# Patient Record
Sex: Male | Born: 1980 | Race: White | Hispanic: No | Marital: Single | Smoking: Current every day smoker
Health system: Southern US, Community
[De-identification: ages and names within clinical notes are randomized; demographics above are authoritative.]

## PROBLEM LIST (undated history)

## (undated) DIAGNOSIS — F3181 Bipolar II disorder: Secondary | ICD-10-CM

---

## 1998-04-10 ENCOUNTER — Emergency Department (HOSPITAL_COMMUNITY): Admission: EM | Admit: 1998-04-10 | Discharge: 1998-04-10 | Payer: Self-pay | Admitting: Emergency Medicine

## 1998-04-18 ENCOUNTER — Emergency Department (HOSPITAL_COMMUNITY): Admission: EM | Admit: 1998-04-18 | Discharge: 1998-04-18 | Payer: Self-pay | Admitting: Emergency Medicine

## 2011-12-20 ENCOUNTER — Encounter (HOSPITAL_COMMUNITY): Payer: Self-pay | Admitting: *Deleted

## 2011-12-20 ENCOUNTER — Emergency Department (HOSPITAL_COMMUNITY)
Admission: EM | Admit: 2011-12-20 | Discharge: 2011-12-21 | Disposition: A | Discharge status: Other Acute Inpatient Hospital | Payer: Self-pay | Attending: Emergency Medicine | Admitting: Emergency Medicine

## 2011-12-20 DIAGNOSIS — R45851 Suicidal ideations: Secondary | ICD-10-CM | POA: Insufficient documentation

## 2011-12-20 DIAGNOSIS — F1410 Cocaine abuse, uncomplicated: Secondary | ICD-10-CM

## 2011-12-20 DIAGNOSIS — F250 Schizoaffective disorder, bipolar type: Secondary | ICD-10-CM

## 2011-12-20 DIAGNOSIS — F17200 Nicotine dependence, unspecified, uncomplicated: Secondary | ICD-10-CM | POA: Insufficient documentation

## 2011-12-20 DIAGNOSIS — F1010 Alcohol abuse, uncomplicated: Secondary | ICD-10-CM

## 2011-12-20 DIAGNOSIS — F319 Bipolar disorder, unspecified: Secondary | ICD-10-CM | POA: Insufficient documentation

## 2011-12-20 HISTORY — DX: Bipolar II disorder: F31.81

## 2011-12-20 LAB — COMPREHENSIVE METABOLIC PANEL
ALT: 19 U/L (ref 0–53)
CO2: 29 mEq/L (ref 19–32)
Calcium: 9.2 mg/dL (ref 8.4–10.5)
Creatinine, Ser: 1.06 mg/dL (ref 0.50–1.35)
GFR calc Af Amer: >90 mL/min (ref >90)
GFR calc non Af Amer: >90 mL/min (ref >90)
Glucose, Bld: 104 mg/dL — ABNORMAL HIGH (ref 70–99)
Sodium: 138 mEq/L (ref 135–145)
Total Bilirubin: 0.2 mg/dL — ABNORMAL LOW (ref 0.3–1.2)

## 2011-12-20 LAB — RAPID URINE DRUG SCREEN, HOSP PERFORMED
Barbiturates: NOT DETECTED
Cocaine: POSITIVE — AB

## 2011-12-20 LAB — CBC
Hemoglobin: 13.8 g/dL (ref 13.0–17.0)
MCH: 30.9 pg (ref 26.0–34.0)
MCV: 89.7 fL (ref 78.0–100.0)
RBC: 4.47 MIL/uL (ref 4.22–5.81)

## 2011-12-20 LAB — URINALYSIS, ROUTINE W REFLEX MICROSCOPIC
Bilirubin Urine: NEGATIVE
Leukocytes, UA: NEGATIVE
Nitrite: NEGATIVE
Specific Gravity, Urine: 1.025 (ref 1.005–1.030)
pH: 6 (ref 5.0–8.0)

## 2011-12-20 MED ORDER — NICOTINE 21 MG/24HR TD PT24
21.0000 mg | MEDICATED_PATCH | Freq: Every day | TRANSDERMAL | Status: DC
  Administered 2011-12-20: 21 mg via TRANSDERMAL
  Filled 2011-12-20: qty 1

## 2011-12-20 MED ORDER — ACETAMINOPHEN 325 MG PO TABS: 650.0000 mg | ORAL_TABLET | ORAL | Status: DC | PRN

## 2011-12-20 MED ORDER — LORAZEPAM 1 MG PO TABS
1.0000 mg | ORAL_TABLET | Freq: Three times a day (TID) | ORAL | Status: DC | PRN
  Administered 2011-12-21: 1 mg via ORAL
  Filled 2011-12-20: qty 1

## 2011-12-20 MED ORDER — ZOLPIDEM TARTRATE 5 MG PO TABS: 5.0000 mg | ORAL_TABLET | Freq: Every evening | ORAL | Status: DC | PRN

## 2011-12-20 MED ORDER — ONDANSETRON HCL 4 MG PO TABS: 4.0000 mg | ORAL_TABLET | Freq: Three times a day (TID) | ORAL | Status: DC | PRN
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
## 2011-12-20 NOTE — ED Notes (Signed)
Denies SI/HI

## 2011-12-20 NOTE — ED Provider Notes (Signed)
Medical screening examination/treatment/procedure(s) were performed by non-physician practitioner and as supervising physician I was immediately available for consultation/collaboration.   Celene Kras, MD 12/20/11 [phone]

## 2011-12-20 NOTE — ED Provider Notes (Signed)
History     CSN: [id number]  Arrival date & time 12/20/11  1645   First MD Initiated Contact with Patient 12/20/11 1824      Chief Complaint  Patient presents with  . Medical Clearance    (Consider location/radiation/quality/duration/timing/severity/associated sxs/prior treatment) Patient is a 31 y.o. male presenting with mental health disorder. The history is provided by the patient.  Mental Health Problem The current episode started more than 1 month ago.  Associated symptoms comments: He reports history of bipolar disorder with frequent highs and lows. REcently he reports a fear of being alone as he becomes suicidal. "I don't know what I'm going to do to myself". . He admits to suicidal ideas. He does have a plan to commit suicide. He contemplates harming himself. He has not already injured self. He does not contemplate injuring another person. He has not already  injured another person.    Past Medical History  Diagnosis Date  . Bipolar 2 disorder     History reviewed. No pertinent past surgical history.  No family history on file.  History  Substance Use Topics  . Smoking status: Current Some Day Smoker  . Smokeless tobacco: Not on file  . Alcohol Use: Yes      Review of Systems  Constitutional: Negative for fever and chills.  HENT: Negative.   Respiratory: Negative.   Cardiovascular: Negative.   Gastrointestinal: Negative.   Musculoskeletal: Negative.   Skin: Negative.   Neurological: Negative.   Psychiatric/Behavioral: Positive for suicidal ideas.    Allergies  Review of patient's allergies indicates no known allergies.  Home Medications  No current outpatient prescriptions on file.  BP 124/85  Pulse 60  Temp(Src) 98.2 F (36.8 C) (Oral)  Resp 18  SpO2 98%  Physical Exam  Constitutional: He appears well-developed and well-nourished.  HENT:  Head: Normocephalic.  Neck: Normal range of motion. Neck supple.  Cardiovascular: Normal rate and  regular rhythm.   Pulmonary/Chest: Effort normal and breath sounds normal.  Abdominal: Soft. Bowel sounds are normal. There is no tenderness. There is no rebound and no guarding.  Musculoskeletal: Normal range of motion.  Neurological: He is alert. No cranial nerve deficit.  Skin: Skin is warm and dry. No rash noted.  Psychiatric: He has a normal mood and affect.    ED Course  Procedures (including critical care time)   Labs Reviewed  CBC  COMPREHENSIVE METABOLIC PANEL  ETHANOL  URINE RAPID DRUG SCREEN (HOSP PERFORMED)  URINALYSIS, ROUTINE W REFLEX MICROSCOPIC   No results found.   No diagnosis found.  1. Suicidal ideation 2. Bipolar   MDM          Rodena Medin, PA-C 12/20/11 1903

## 2011-12-20 NOTE — ED Notes (Addendum)
Wrong pt

## 2011-12-20 NOTE — ED Notes (Signed)
Pt reports being seen at [facility] for SI Sunday, was admitted and discharged Wednesday.  Pt states "im just concerned for my own well being right now."  Pt reports he's been drinking all week to try to take care of it myself.  Pt reports last drink was early this am, last cocaine was x 2 days ago.  Pt states that when he was admitted, he was "so doped up" with seroquel and i was not with it when the doctor came in to talk to me, and then when they sent him home, they just gave him rx  And pt reports that he does not have any money to fill them.  Pt reports almost overdosing on alcohol.  Pt reports he was at the hospital and was having seizures.

## 2011-12-20 NOTE — ED Notes (Signed)
Please see triage note

## 2011-12-21 ENCOUNTER — Encounter (HOSPITAL_COMMUNITY): Payer: Self-pay | Admitting: *Deleted

## 2011-12-21 ENCOUNTER — Inpatient Hospital Stay (HOSPITAL_COMMUNITY)
Admission: AD | Admit: 2011-12-21 | Discharge: 2011-12-27 | DRG: 885 | Disposition: A | Discharge status: Home / Self Care | Payer: PRIVATE HEALTH INSURANCE | Source: Ambulatory Visit | Attending: Psychiatry | Admitting: Psychiatry

## 2011-12-21 DIAGNOSIS — F1410 Cocaine abuse, uncomplicated: Secondary | ICD-10-CM | POA: Diagnosis present

## 2011-12-21 DIAGNOSIS — F250 Schizoaffective disorder, bipolar type: Secondary | ICD-10-CM | POA: Diagnosis present

## 2011-12-21 DIAGNOSIS — F3110 Bipolar disorder, current episode manic without psychotic features, unspecified: Principal | ICD-10-CM | POA: Diagnosis present

## 2011-12-21 DIAGNOSIS — F1010 Alcohol abuse, uncomplicated: Secondary | ICD-10-CM | POA: Diagnosis present

## 2011-12-21 DIAGNOSIS — F259 Schizoaffective disorder, unspecified: Secondary | ICD-10-CM | POA: Diagnosis present

## 2011-12-21 LAB — GLUCOSE, CAPILLARY: Glucose-Capillary: 87 mg/dL (ref 70–99)

## 2011-12-21 LAB — LITHIUM LEVEL: Lithium Lvl: 0.29 mEq/L — ABNORMAL LOW (ref 0.80–1.40)

## 2011-12-21 MED ORDER — ADULT MULTIVITAMIN W/MINERALS CH
1.0000 | ORAL_TABLET | Freq: Every day | ORAL | Status: DC
  Administered 2011-12-21 – 2011-12-27 (×7): 1 via ORAL
  Filled 2011-12-21 (×8): qty 1

## 2011-12-21 MED ORDER — TRAZODONE HCL 100 MG PO TABS
100.0000 mg | ORAL_TABLET | Freq: Every evening | ORAL | Status: DC | PRN
  Administered 2011-12-21 – 2011-12-23 (×2): 100 mg via ORAL
  Filled 2011-12-21 (×2): qty 1

## 2011-12-21 MED ORDER — NICOTINE 14 MG/24HR TD PT24
14.0000 mg | MEDICATED_PATCH | Freq: Every day | TRANSDERMAL | Status: DC
  Administered 2011-12-22 – 2011-12-26 (×5): 14 mg via TRANSDERMAL
  Filled 2011-12-21 (×9): qty 1

## 2011-12-21 MED ORDER — OLANZAPINE 10 MG PO TABS
10.0000 mg | ORAL_TABLET | Freq: Every day | ORAL | Status: DC
  Administered 2011-12-21: 10 mg via ORAL
  Filled 2011-12-21 (×3): qty 1

## 2011-12-21 MED ORDER — CARBAMAZEPINE 200 MG PO TABS
200.0000 mg | ORAL_TABLET | Freq: Every day | ORAL | Status: DC
  Filled 2011-12-21: qty 1

## 2011-12-21 MED ORDER — ADULT MULTIVITAMIN W/MINERALS CH: 1.0000 | ORAL_TABLET | Freq: Every day | ORAL | Status: DC

## 2011-12-21 MED ORDER — LITHIUM CARBONATE ER 450 MG PO TBCR
450.0000 mg | EXTENDED_RELEASE_TABLET | Freq: Every day | ORAL | Status: DC
  Administered 2011-12-21 – 2011-12-22 (×2): 450 mg via ORAL
  Filled 2011-12-21 (×4): qty 1

## 2011-12-21 MED ORDER — CARBAMAZEPINE 200 MG PO TABS
200.0000 mg | ORAL_TABLET | Freq: Every day | ORAL | Status: DC
  Administered 2011-12-21: 200 mg via ORAL
  Filled 2011-12-21 (×3): qty 1

## 2011-12-21 MED ORDER — MAGNESIUM HYDROXIDE 400 MG/5ML PO SUSP: 30.0000 mL | Freq: Every day | ORAL | Status: DC | PRN

## 2011-12-21 MED ORDER — MULTI-VITAMIN/MINERALS PO TABS: 1.0000 | ORAL_TABLET | Freq: Every day | ORAL | Status: DC

## 2011-12-21 MED ORDER — OLANZAPINE 5 MG PO TABS: 10.0000 mg | ORAL_TABLET | Freq: Every day | ORAL | Status: DC

## 2011-12-21 MED ORDER — ALUM & MAG HYDROXIDE-SIMETH 200-200-20 MG/5ML PO SUSP
30.0000 mL | ORAL | Status: DC | PRN
  Administered 2011-12-24 – 2011-12-27 (×2): 30 mL via ORAL

## 2011-12-21 MED ORDER — ACETAMINOPHEN 325 MG PO TABS
650.0000 mg | ORAL_TABLET | Freq: Four times a day (QID) | ORAL | Status: DC | PRN
  Administered 2011-12-25: 650 mg via ORAL

## 2011-12-21 NOTE — Discharge Planning (Signed)
Patient has been accepted to [facility] by [facility]  bed 404-2. Patient's support paperwork has been completed. EDP notified and is in agreement with disposition. EDP will discharge pt to [facility]. Pt nurse notified as well. ALL appropriate paperwork completed and forwarded to [facility] for review.   Manson Passey Castiel Lauricella ANN S , MSW, LCSWA 12/21/2011 10:25 AM [phone]

## 2011-12-21 NOTE — ED Notes (Signed)
Patient denies pain and is taking a shower at this time.

## 2011-12-21 NOTE — ED Notes (Signed)
Patient has been denied admission [facility] per act team.

## 2011-12-21 NOTE — Progress Notes (Addendum)
Patient ID: Glenn Rosales, male   DOB: 02/17/1981, 31 y.o.   MRN: [id number] 31 year old Caucasian male admitted with diagnosis of bipolar mania.  Patient states he was has [facility] from Sunday to Wednesday last week and did not fill prescriptions on release.  States he could not afford to.  States he was using cocaine and actually injected it for the first time.  He has a girlfriend and has four children with her, but says he cannot be with her until he "gets himself right."  States he had a job at [employer], but does not think he has that job any longer.  He was pleasant and cooperative with the admission process.  He was eating a lunch tray when he came into the search room and he finished it during the interview.  He was escorted back to the unit and oriented to the unit, the group schedules, and the medication times.  Patient minimized his drug and alcohol use during the admission stating that he only drank about once every two weeks.  He admitted that he drank excessively on these occasions, but it was only every two weeks.  He was also vague about his cocaine usage.  It is unclear how much or how often he was using.  States he was snorting and smoking it and yesterday was the first time he injected it.  Does not know what made him do this.  States he would like to "explore other options" when he gets ready to leave here.

## 2011-12-21 NOTE — Progress Notes (Signed)
In dayroom, watching TV with peers on approach. Agreed to speak with Clinical research associate. Appears flat and depressed. Calm and cooperative with assessment. No acute distress noted. States he feels like he is adjusting well to the milieu. States he is glad to be somewhere where they are checking his labs. States he has been taking Lithium for some time and has never had his Lithium levels checked. Support and encouragement provided. Did asked for a nicotine patch. Patch was ordered and he was informed it would begin tomorrow morning which he was agreeable with. Denies Pain. Denies SI/HI/AVH and contracts for safety. Offered no additional questions or concerns. POC and medications for the shift reviewed and understanding verbalized. Safety has been maintained with Q15 minute observation. Will continue current POC.

## 2011-12-22 DIAGNOSIS — F259 Schizoaffective disorder, unspecified: Secondary | ICD-10-CM

## 2011-12-22 LAB — T3, FREE: T3, Free: 3.2 pg/mL (ref 2.3–4.2)

## 2011-12-22 LAB — TSH: TSH: 0.5 u[IU]/mL (ref 0.350–4.500)

## 2011-12-22 MED ORDER — LITHIUM CARBONATE ER 450 MG PO TBCR
450.0000 mg | EXTENDED_RELEASE_TABLET | Freq: Two times a day (BID) | ORAL | Status: DC
  Filled 2011-12-22 (×2): qty 1

## 2011-12-22 MED ORDER — LITHIUM CARBONATE ER 450 MG PO TBCR
450.0000 mg | EXTENDED_RELEASE_TABLET | ORAL | Status: DC
  Administered 2011-12-22 – 2011-12-27 (×10): 450 mg via ORAL
  Filled 2011-12-22 (×13): qty 1

## 2011-12-22 MED ORDER — OLANZAPINE 5 MG PO TABS
5.0000 mg | ORAL_TABLET | Freq: Every day | ORAL | Status: DC
  Administered 2011-12-22 – 2011-12-26 (×5): 5 mg via ORAL
  Filled 2011-12-22 (×6): qty 1

## 2011-12-22 MED ORDER — SERTRALINE HCL 50 MG PO TABS
50.0000 mg | ORAL_TABLET | Freq: Every day | ORAL | Status: DC
  Administered 2011-12-22 – 2011-12-24 (×3): 50 mg via ORAL
  Filled 2011-12-22 (×5): qty 1

## 2011-12-22 NOTE — Progress Notes (Signed)
Patient ID: Glenn Rosales, male   DOB: [birth year], 31 y.o.   MRN: [id number] Pt is asleep in bed this PM but is participating in the milieu. Pt denies SI/HI and AVH. Pt is attending groups and is cooperative with staff. Pt forwards little to staff. Writer encouraged adequate fluid intake 2.5-3 L/day while using lithium therapy. Pt verbally acknowledged understanding.
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
## 2011-12-22 NOTE — Progress Notes (Signed)
12/22/2011         Time: 0930      Group Topic/Focus: The focus of this group is on enhancing patients' problem solving skills, which involves identifying the problem, brainstorming solutions and choosing and trying a solution.   Participation Level: Minimal  Participation Quality: Drowsy  Affect: Blunted  Cognitive: Oriented  Additional Comments: Patient laying on furniture, even standing with his eyes closed. Patient reports feeling "too weak."   Jefry Lesinski 12/22/2011 1:42 PM

## 2011-12-22 NOTE — Progress Notes (Signed)
Pt has been appropriate attending groups on the unit and has been sleeping on and off between groups. Pt denies si and hi. He rates depression as a 6 on 1-10 scale with 10 being the most depressed. Offered support and 15 minute checks. Safety maintained on unit.

## 2011-12-22 NOTE — Discharge Planning (Signed)
Met with patient in Aftercare Planning Group.   He slept through early part of group, then was irritable but not directed at anyone particular.  Patient states he does not know where he will go at discharge.  He has a home in [place] with his girlfriend and 4 children, but does not want to return there.  He states he wants a different life sometimes.  He remains positive for SI, states he likes taking various drugs because they make him feel different and that is desirable for him.  Patient was recently at [facility], was discharged recently with medication prescriptions.  While the assessment report states he could not afford those medications, he admitted to CM that he did not look into getting those meds, and he actually had been told at the hospital that some of them were on the $4 list at [facility], but he did not check into it.  Instead, he went on a binge after D/C from [facility].  He was using alcohol, marijuana, and cocaine; in fact, tried cocaine through IV for first time.  Patient was receiving services previously from [facility] in [place], but is not currently enrolled with them.  He states he went through their 45-month rehabilitation program.  He does not have current provider in place.  Patient rambled for some time about his job at [employer]	[place] and how if he kept that job he was going to kill himself and/or somebody else.  He said they were driving him crazy, externalized all factors involved with his job.  When CM asked if he needed help contacting them re being in the hospital, he stated he "gave that job away."  Patient states that his goal for his hospitalization is to be "stable minded", and added that a big problem for him is that he changes his mind constantly about things, including his feelings.  Glenn Mantle, LCSW 12/22/2011, 3:51 PM

## 2011-12-22 NOTE — H&P (Signed)
Psychiatric Admission Assessment Adult  Patient Identification:  Glenn Rosales Date of Evaluation:  12/22/2011  Chief Complaint:  BIPOLAR DISORDER MANIC  SUB ABUSE  History of Present Illness:: This is a 31 year old Caucasian male, admitted to [facility] from the [facility] ED with complaints of bipolar mania (periods of highs and lows). Patient reports, I came to this hospital to get my thoughts regulated. My thought pattern is all over the place. I have problem keeping track of my own thoughts. The things that I wanted to do changes so fast. I cannot reach any goal and I have reached any goals in my life. This has been going on with me off and on for many years. Sometimes it gets better on it's own, other time, it will get worse that I have go the hospital. I was at the [facility] last Wednesday and Sunday as well. I have no money to afford my medicines. I take Lithium Carbonate 450 mg every day. It is helping when I have it and can take it.  I am on Zyprexa and Tegretol as well. Tegretol makes me very sleepy. This is my first time in this hospital. I was at [facility] in 2009 for the problems. I drink and use cocaine sometimes just quiet my mind a little"  Mood Symptoms:  Hypomania/Mania, Mood Swings, Past 2 Weeks, Sadness, SI,  Depression Symptoms:  psychomotor agitation, difficulty concentrating,  (Hypo) Manic Symptoms:  Distractibility, Elevated Mood, Flight of Ideas, Irritable Mood,  Anxiety Symptoms:  Excessive Worry,  Psychotic Symptoms:  Hallucinations: None  PTSD Symptoms: Had a traumatic exposure:  None reported.  Past Psychiatric History: Diagnosis: Schizoaffective disorder  Hospitalizations: [facility], [facility]  Outpatient Care: "I don't have one"  Substance Abuse Care: None reported  Self-Mutilation: None reported  Suicidal Attempts: "No attempts, just thoughts"  Violent Behaviors: None reported   Past Medical History:   Past Medical History    Diagnosis Date  . Bipolar 2 disorder    None.  Allergies:  No Known Allergies  PTA Medications: Prescriptions prior to admission  Medication Sig Dispense Refill  . carbamazepine (TEGRETOL) 200 MG tablet Take 200 mg by mouth at bedtime.      Marland Kitchen lithium carbonate (ESKALITH) 450 MG CR tablet Take 450 mg by mouth daily. qhs      . Multiple Vitamins-Minerals (MULTIVITAMIN WITH MINERALS) tablet Take 1 tablet by mouth daily.      Marland Kitchen OLANZapine (ZYPREXA) 10 MG tablet Take 10 mg by mouth at bedtime.        Previous Psychotropic Medications:  Medication/Dose  Lithium Carbonate 450 mg  Tegretol 200 mg  Zyprexa 10 mg           Substance Abuse History in the last 12 months: Substance Age of 1st Use Last Use Amount Specific Type  Nicotine 15 Prior to hosp 5 cigarettes daily Cigarettes  Alcohol 18 "I drank last 2 weeks ago" 2-3 bottles of beer Beer  Cannabis Denies use     Opiates Denies use     Cocaine 16 Prior to hosp "I use what ever I have at a time" Crack  Methamphetamines Denies use     LSD Denies use     Ecstasy Denies use     Benzodiazepines Denies use     Caffeine      Inhalants      Others:  Consequences of Substance Abuse: Medical Consequences:  Liver damage, Legal Consequences:  Arrests, jail time Family Consequences:  Family discord  Social History: Current Place of Residence:  [place] copy[place] of Birth:  [place]  Family Members: "I have 4 children'  Marital Status:  Single  Children: 4     Relationships: "I have a girlfriend"  Education:  Special educational needs teacher Problems/Performance: None reported  Religious Beliefs/Practices: None reported  History of Abuse (Emotional/Phsycial/Sexual): None reported  Occupational Experiences: Insurance underwriter History:  None.  Legal History: None reported  Hobbies/Interests: None reported  Family History:  No family history on file.  Mental Status  Examination/Evaluation: Objective:  Appearance: Casual  Eye Contact::  Fair  Speech:  Clear and Coherent  Volume:  Normal  Mood:  Depressed  Affect:  Flat  Thought Process:  Coherent  Orientation:  Full  Thought Content:  Rumination  Suicidal Thoughts:  Yes.  without intent/plan  Homicidal Thoughts:  No  Memory:  Immediate;   Good Recent;   Good Remote;   Good  Judgement:  Poor  Insight:  Fair  Psychomotor Activity:  Normal  Concentration:  Fair  Recall:  Good  Akathisia:  No  Handed:  Right  AIMS (if indicated):     Assets:  Desire for Improvement  Sleep:  Number of Hours: 6.75     Laboratory/X-Ray: None Psychological Evaluation(s)      Assessment:    AXIS I:  Schizoaffective Disorder, bipolar type AXIS II:  Deferred AXIS III:   Past Medical History  Diagnosis Date  . Bipolar 2 disorder    AXIS IV:  educational problems, occupational problems, other psychosocial or environmental problems and problems related to social environment AXIS V:  21-30 behavior considerably influenced by delusions or hallucinations OR serious impairment in judgment, communication OR inability to function in almost all areas  Treatment Plan/Recommendations: Admit for safety and stabilization.                                                                Review and reinstate any pertinent home medications.                                                                Increase Lithium to 450 mg bid.                                                                Obtain lithium levels 12/23/11 night.  Treatment Plan Summary: Daily contact with patient to assess and evaluate symptoms and progress in treatment Medication management  Current Medications:  Current Facility-Administered Medications  Medication Dose Route Frequency Provider Last Rate Last Dose  . acetaminophen (TYLENOL) tablet 650 mg  650 mg Oral Q6H PRN Ronny Bacon, MD      . alum & mag hydroxide-simeth (MAALOX/MYLANTA)  200-200-20 MG/5ML suspension 30 mL  30 mL Oral Q4H PRN Curlene Labrum Readling, MD      . carbamazepine (TEGRETOL) tablet 200 mg  200 mg Oral QHS Curlene Labrum Readling, MD   200 mg at 12/21/11 2149  . lithium carbonate (ESKALITH) CR tablet 450 mg  450 mg Oral Daily Curlene Labrum Readling, MD   450 mg at 12/22/11 0812  . magnesium hydroxide (MILK OF MAGNESIA) suspension 30 mL  30 mL Oral Daily PRN Ronny Bacon, MD      . mulitivitamin with minerals tablet 1 tablet  1 tablet Oral Daily Curlene Labrum Readling, MD   1 tablet at 12/22/11 0812  . nicotine (NICODERM CQ - dosed in mg/24 hours) patch 14 mg  14 mg Transdermal Daily Curlene Labrum Readling, MD   14 mg at 12/22/11 1610  . OLANZapine (ZYPREXA) tablet 10 mg  10 mg Oral QHS Curlene Labrum Readling, MD   10 mg at 12/21/11 2149  . traZODone (DESYREL) tablet 100 mg  100 mg Oral QHS PRN Ronny Bacon, MD   100 mg at 12/21/11 2151  . DISCONTD: multivitamin with minerals tablet 1 tablet  1 tablet Oral Daily Ronny Bacon, MD        Observation Level/Precautions:  Q 15 minutes checks for safety  Laboratory:  Reviewed and noted ED lab findings on file.  Psychotherapy:  Group  Medications:  See medication list  Routine PRN Medications:  Yes  Consultations:  None indicated  Discharge Concerns:  Safety  Other:     Armandina Stammer I 4/3/20131:26 PM

## 2011-12-23 NOTE — Tx Team (Signed)
Interdisciplinary Treatment Plan Update (Adult)  Date:  12/23/2011  Time Reviewed:  10:15AM-11:00AM  Progress in Treatment: Attending groups:  Yes Participating in groups:    Yes Taking medication as prescribed:    Yes Tolerating medication:   Yes Family/Significant other contact made:  No, still needed Patient understands diagnosis:   Yes, to some extent Discussing patient identified problems/goals with staff:    Medical problems stabilized or resolved:   Yes Denies suicidal/homicidal ideation:  Yes Issues/concerns per patient self-inventory:   None Other:    New problem(s) identified: No, Describe:    Reason for Continuation of Hospitalization: Anxiety Depression Medication stabilization Other; describe Placement  Interventions implemented related to continuation of hospitalization:  Medication monitoring and adjustment, safety checks Q15 min., suicide risk assessment, group therapy, psychoeducation, collateral contact, aftercare planning, ongoing physician assessments, medication education  Additional comments:  Not applicable  Estimated length of stay:  3-4 days  Discharge Plan:  Undecided, is looking at [facility] versus [facility]; medication management will be set according to where he goes (which city)  [employer]):  Not applicable  Review of initial/current patient goals per problem list:   1.  Goal(s):  Determine aftercare placement and follow-up  Met:  No  Target date:  By Discharge   As evidenced by:  Still working on this - will either go to shelter or to an [facility] - both options being explored  2.  Goal(s):  Decrease mania to baseline level.  Met:  Yes  Target date:  By Discharge   As evidenced by:  Not manic  3.  Goal(s):  Determine if & how to address substance abuse  Met:  Yes  Target date:  By Discharge   As evidenced by:  Does not want to do anything re this issue other than going to his groups  4.  Goal(s):   Decrease anxiety to no greater than 3 at discharge.  Met:  No  Target date:  By Discharge   As evidenced by:  "7" today  Attendees: Patient:  Glenn Rosales  12/23/2011 10:15AM-11:00AM  Family:     Physician:  Dr. Harvie Heck Readling 12/23/2011 10:15AM-11:00AM  Nursing:   Waynetta Sandy, RN 12/23/2011 10:15AM -11:00AM   Case Manager:  Ambrose Mantle, LCSW 12/23/2011 10:15AM-11:00AM  Counselor:    Other:   Lynann Bologna, NP 12/23/2011 10:15AM-11:00AM  Other:      Other:      Other:       Scribe for Treatment Team:   Sarina Ser, 12/23/2011, 10:15AM-11:15AM .

## 2011-12-23 NOTE — Progress Notes (Addendum)
Pt is sleepy this morning and reports that he has an unusual sleep pattern related to his job in [employer]. He sleeps for a few hours and goes to work, returns home and sleeps a few hours then goes back to work. Pt denies si, hi or voices. His affect is blunted and depressed. Offered support and 15 minute checks. Safety maintained on unit. Pulse less than 60, reported to NP.

## 2011-12-23 NOTE — Progress Notes (Signed)
Patient in room sleeping when approached for PSA. Patient very groggy and did not want to participate at time ask and also refused option of completing in one hour. Patient did agree to meet tomorrow for completion. Patient chose to think about who he is willing to sign release for in order for counselor to provide suicide prevention information.  Clide Dales 12/23/2011 4:14 PM

## 2011-12-23 NOTE — Progress Notes (Signed)
Currently resting quietly in bed in right lateral position with eyes closed. Respirations are even and unlabored. No acute pain or distress noted. Safety has been maintained with Q85minute observation. Will continue current POC.

## 2011-12-24 MED ORDER — SERTRALINE HCL 100 MG PO TABS
100.0000 mg | ORAL_TABLET | Freq: Every day | ORAL | Status: DC
  Administered 2011-12-25 – 2011-12-27 (×3): 100 mg via ORAL
  Filled 2011-12-24 (×4): qty 1

## 2011-12-24 MED ORDER — TRAZODONE HCL 100 MG PO TABS
100.0000 mg | ORAL_TABLET | Freq: Every day | ORAL | Status: DC
  Administered 2011-12-24 – 2011-12-26 (×3): 100 mg via ORAL
  Filled 2011-12-24 (×4): qty 1

## 2011-12-24 MED ORDER — PANTOPRAZOLE SODIUM 40 MG PO TBEC
40.0000 mg | DELAYED_RELEASE_TABLET | Freq: Every day | ORAL | Status: DC
  Administered 2011-12-24 – 2011-12-26 (×3): 40 mg via ORAL
  Filled 2011-12-24 (×4): qty 1

## 2011-12-24 NOTE — Progress Notes (Signed)
Pt has been pleasant and appropriate  He attended group and is compliant with treatment   Pt reports being ready for discharge  He denies suicidal and homicidal ideation   And denies voices    Verbal support given  Medications administered and effectiveness monitored   Q 15 min checks  Pt safe at present

## 2011-12-24 NOTE — Progress Notes (Signed)
Patient ID: Glenn Rosales, male   DOB: 28-Sep-1980, 31 y.o.   MRN: [id number] Pt is awake and active on the unit this PM. Pt denies SI/HI and AVH. Pt is attending groups and is cooperative with staff. Pt states that he feels ready to d/c and that he just has to make a decision about where to live after he leaves. Pt is withdrawn but is interacting well in the milieu.

## 2011-12-24 NOTE — Progress Notes (Signed)
Pt's affect is flat and depressed. 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 Pt is considering living in [place], but is concerned over transportation to [employer] to see his children. Offered support, encouragement and 15 minute checks. Safety maintained on unit.

## 2011-12-24 NOTE — Progress Notes (Signed)
[facility] MD Progress Note  12/24/2011 12:38 PM  Diagnosis:  Axis I: Schizoaffective Disorder - Bipolar Type.  Alcohol Abuse.  Cocaine Abuse.   The patient was seen today and reports the following:   Sleep: The patient reports to having some difficulty initiating sleep but was able to maintain sleep without difficulty.  Appetite: The patient reports a good appetite.   Mild>(1-10) >Severe  Hopelessness (1-10): 3  Depression (1-10): 5  Anxiety (1-10): 0   Suicidal Ideation: The patient adamantly denies any suicidal ideations today.  Plan: No  Intent: No  Means: No   Homicidal Ideation: The patient adamantly denies any homicidal ideations today.  Plan: No  Intent: No.  Means: No   Eye Contact: Good.  General Appearance/Behavior: The patient remained friendly and cooperative during the interview today. Motor Behavior: wnl.  Speech: Appropriate in rate and volume with no pressuring of speech today.  Mental Status: Alert and Oriented x 3  Level of Consciousness: Alert Mood: Moderately Depressed.  Affect: Moderately Constricted.  Anxiety: No anxiety reported today.  Thought Process: wnl.  Thought Content: The patient denied any auditory or visual hallucinations today. He also denied any delusional thinking.  Perception: wnl.  Judgment: Fair to Good.  Insight: Fair to Good.  Cognition: Orientated to time, place and person.  Sleep:  Number of Hours: 4.5    Vital Signs:Blood pressure 129/84, pulse 65, temperature 98 F (36.7 C), temperature source Oral, resp. rate 16.  Current Medications: Current Facility-Administered Medications  Medication Dose Route Frequency Provider Last Rate Last Dose  . acetaminophen (TYLENOL) tablet 650 mg  650 mg Oral Q6H PRN Curlene Labrum Dariyah Garduno, MD      . alum & mag hydroxide-simeth (MAALOX/MYLANTA) 200-200-20 MG/5ML suspension 30 mL  30 mL Oral Q4H PRN Curlene Labrum Glendale Wherry, MD   30 mL at 12/24/11 0937  . lithium carbonate (ESKALITH) CR tablet 450 mg  450 mg  Oral BH-qamhs Curlene Labrum Elysa Womac, MD   450 mg at 12/24/11 0758  . magnesium hydroxide (MILK OF MAGNESIA) suspension 30 mL  30 mL Oral Daily PRN Ronny Bacon, MD      . mulitivitamin with minerals tablet 1 tablet  1 tablet Oral Daily Ronny Bacon, MD   1 tablet at 12/24/11 0757  . nicotine (NICODERM CQ - dosed in mg/24 hours) patch 14 mg  14 mg Transdermal Daily Curlene Labrum Siena Poehler, MD   14 mg at 12/24/11 0758  . OLANZapine (ZYPREXA) tablet 5 mg  5 mg Oral QHS Curlene Labrum Silas Sedam, MD   5 mg at 12/23/11 2140  . sertraline (ZOLOFT) tablet 100 mg  100 mg Oral Daily Curlene Labrum Jamail Cullers, MD      . traZODone (DESYREL) tablet 100 mg  100 mg Oral QHS PRN Curlene Labrum Merwyn Hodapp, MD   100 mg at 12/23/11 2337  . DISCONTD: sertraline (ZOLOFT) tablet 50 mg  50 mg Oral Daily Curlene Labrum Roseanna Koplin, MD   50 mg at 12/24/11 1610   Lab Results:  Results for orders placed during the hospital encounter of 12/21/11 (from the past 48 hour(s))  LITHIUM LEVEL     Status: Abnormal   Collection Time   12/23/11  8:13 PM      Component Value Range Comment   Lithium Lvl 0.30 (*) 0.80 - 1.40 (mEq/L)    Time was spent today discussing with the patient his current symptoms.  The patient states that he is having difficulty initiating sleep but is able to maintain sleep  once given the medication Trazodone.  The patient reports moderate depression but with no SI/HI.  He also denies any anxiety, auditory or visual hallucinations or delusional thinking.  The patient reports that he has yet to make up his mind where he will live at discharge but has a friend who is willing to assist him in getting into an [address].  He also reports that each day he feels improved regarding his psychiatric symptoms.  Treatment Plan Summary:  1. Daily contact with patient to assess and evaluate symptoms and progress in treatment  2. Medication management  3. The patient will deny suicidal ideations or homicidal ideations for 48 hours prior to discharge and have a  depression and anxiety rating of 3 or less. The patient will also deny any auditory or visual hallucinations or delusional thinking or display any manic or hypomanic behaviors.  4. The patient will display no evidence of substance withdrawal at time of discharge.   Plan:  1. Will continue the patient's current medications as ordered. 2. Will increase the medication Zoloft to 100 mgs po q am to further address his depressive symptoms. 3. Will change the medication Trazodone to 100 mgs po qhs instead of prn to help address the patient's sleep difficulties. 4. Laboratory studies reviewed.  5. Will continue to monitor.  6. Will repeat serum lithium level on December 26, 2011.   Glenn Rosales 12/24/2011, 12:38 PM
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
## 2011-12-24 NOTE — Progress Notes (Signed)
Patient ID: Glenn Rosales, male   DOB: 1981-07-24, 31 y.o.   MRN: [id number] Subjective: He reports he feels very good on the current meds.  He slept well all through the night.  Staff have complained that he is sleeping a lot during the day but he attributes this to needing to catch up on sleep after not sleeping for 10 days in a row while in a 'euphoric' mood prior to admission.   Past Psych Hx:  He reports being diagnosed with Bipolar disorder about 10 years ago, and has done best on Lithium in the past.  Depakote made him 'blow-up and gain a lot of weight', and he has also taken Trileptal and Abilify at various times.  He thinks the medications he is on now are working well and that he is improving.  He complains increase in anxiety and anticipation, now that he is no longer euphoric - he is realizing the state his life is in, and he is facing re-starting a lot of things again - such as having to look for work. Also not sure he is welcome back with his girlfriend anymore - and will be homeless.  He is encouraged by the Case Manager's efforts to get resources organized for him.   He rates his depression a 5/10 today on a 1-10 scale if 10 is the worst symptoms. He denies suicidal thoughts, and denies homicidal thoughts.  His appetite is good, and sleep is good.  Hopelessness is a 0/10, and Anxiety is a 7/10.   Objective: Glenn Rosales presents fully alert, calm, and cooperative.  Appropriately dressed and groomed. Thinking is linear, goal directed and well organized with appropriate questions.  Adequate insight.  Thinking about the future and wanting to get back to working.  No evidence of suicidal thoughts or intent. Speech normal.  Affect appropriate.  Polite and pleasant.  Thinking about discharge.  Cognition completely intact.  No evidence of EPS, motor is smooth.  Gait is normal.    Plan: Discussed in team meeting and team agrees he may be ready for discharge tomorrow. Cathleen Fears RN from the  [employer] will visit late today.  Stable on medications and will continue current plan.

## 2011-12-24 NOTE — Discharge Planning (Signed)
Patient was given [facility] yesterday for both [place] and [place].  He is still undecided about return to his family (girlfriend and 4 children) at discharge.  Patient does not want rehab.  Utilization review done for additional days.  Ambrose Mantle, LCSW 12/24/2011, 2:06 PM

## 2011-12-25 NOTE — Progress Notes (Signed)
[facility] MD Progress Note  12/25/2011 2:10 PM  Diagnosis:  Axis I: Schizoaffective Disorder - Bipolar Type.  Alcohol Abuse.  Cocaine Abuse.   Glenn Rosales was seen and assessed this morning. He reports feeling "about the same" today, and describes his mood as "decent." He says he hasn't had any "plummets or highs" recently. He describes his sleep as "good" and denies any problems with appetite or energy. He denies any side effects from his current medications.  Suicidal Ideation: The patient adamantly denies any suicidal ideations today.  Plan: No  Intent: No  Means: No   Homicidal Ideation: The patient adamantly denies any homicidal ideations today.  Plan: No  Intent: No.  Means: No   Eye Contact: Good.  General Appearance/Behavior: The patient remained friendly and cooperative during the interview today. Motor Behavior: wnl.  Speech: Appropriate in rate and volume with no pressuring of speech today.  Mental Status: Alert and Oriented x 3  Level of Consciousness: Alert Mood: "good" Affect: Moderately Constricted.  Anxiety: No anxiety reported today.  Thought Process: linear and goal-directed  Thought Content: The patient denied any auditory or visual hallucinations today. He also denied any delusional thinking. He denies SI/HI as noted above. Perception: wnl.  Judgment: Fair to Good.  Insight: Fair to Good.  Cognition: Orientated to time, place and person.  Sleep:  Number of Hours: 5.5    Vital Signs:Blood pressure 122/85, pulse 97, temperature 97.9 F (36.6 C), temperature source Oral, resp. rate 18.  Current Medications: Current Facility-Administered Medications  Medication Dose Route Frequency Provider Last Rate Last Dose  . acetaminophen (TYLENOL) tablet 650 mg  650 mg Oral Q6H PRN Ronny Bacon, MD   650 mg at 12/25/11 0903  . alum & mag hydroxide-simeth (MAALOX/MYLANTA) 200-200-20 MG/5ML suspension 30 mL  30 mL Oral Q4H PRN Curlene Labrum Readling, MD   30 mL at 12/24/11 0937    . lithium carbonate (ESKALITH) CR tablet 450 mg  450 mg Oral BH-qamhs Curlene Labrum Readling, MD   450 mg at 12/25/11 0843  . magnesium hydroxide (MILK OF MAGNESIA) suspension 30 mL  30 mL Oral Daily PRN Ronny Bacon, MD      . mulitivitamin with minerals tablet 1 tablet  1 tablet Oral Daily Ronny Bacon, MD   1 tablet at 12/25/11 0843  . nicotine (NICODERM CQ - dosed in mg/24 hours) patch 14 mg  14 mg Transdermal Daily Curlene Labrum Readling, MD   14 mg at 12/25/11 0843  . OLANZapine (ZYPREXA) tablet 5 mg  5 mg Oral QHS Curlene Labrum Readling, MD   5 mg at 12/24/11 2141  . pantoprazole (PROTONIX) EC tablet 40 mg  40 mg Oral QHS Curlene Labrum Readling, MD   40 mg at 12/24/11 2141  . sertraline (ZOLOFT) tablet 100 mg  100 mg Oral Daily Curlene Labrum Readling, MD   100 mg at 12/25/11 0843  . traZODone (DESYREL) tablet 100 mg  100 mg Oral QHS Curlene Labrum Readling, MD   100 mg at 12/24/11 2141   Lab Results:  Results for orders placed during the hospital encounter of 12/21/11 (from the past 48 hour(s))  LITHIUM LEVEL     Status: Abnormal   Collection Time   12/23/11  8:13 PM      Component Value Range Comment   Lithium Lvl 0.30 (*) 0.80 - 1.40 (mEq/L)    Treatment Plan Summary:  1. Daily contact with patient to assess and evaluate symptoms and progress in  treatment  2. Medication management  3. The patient will deny suicidal ideations or homicidal ideations for 48 hours prior to discharge and have a depression and anxiety rating of 3 or less. The patient will also deny any auditory or visual hallucinations or delusional thinking or display any manic or hypomanic behaviors.  4. The patient will display no evidence of substance withdrawal at time of discharge.   Plan:  1. Will continue the patient's current medications as ordered. 2. Laboratory studies reviewed.  3. Will continue to monitor.  4. Will repeat serum lithium level on December 26, 2011.   Eligah East 12/25/2011, 2:10 PM

## 2011-12-25 NOTE — Progress Notes (Signed)
[facility] Group Notes:  (Counselor/Nursing/MHT/Case Management/Adjunct)  12/25/2011 3:20 PM  Type of Therapy:  Group Therapy  Participation Level:  Did Not Attend    Neila Gear 12/25/2011, 3:20 PM

## 2011-12-25 NOTE — Progress Notes (Signed)
Pt has isolated to his room most of the day  He is depressed and sad  He has limited interaction with others  His speech and  thinking is logical and coherent.  He denies SI/HI and psychotic symptoms   Verbal support given  Medications administered and effectiveness monitored  Q 15 min checks  Pt safe at present

## 2011-12-25 NOTE — Progress Notes (Signed)
Patient ID: Glenn Rosales, male   DOB: 04-19-1981, 31 y.o.   MRN: [id number] The patient complained of feeling anxious and wanted something for anxiety. Stated he was not sure if the doctor realized that anxiety was an ongoing problem he had. Was able to to interact in the milieu appropriately. Attended evening group. Denied any A/V hallucinations.

## 2011-12-26 LAB — LITHIUM LEVEL: Lithium Lvl: 0.32 mEq/L — ABNORMAL LOW (ref 0.80–1.40)

## 2011-12-26 MED ORDER — HYDROXYZINE HCL 50 MG PO TABS
50.0000 mg | ORAL_TABLET | ORAL | Status: AC
  Administered 2011-12-26: 50 mg via ORAL

## 2011-12-26 NOTE — Progress Notes (Signed)
[facility] Group Notes:  (Counselor/Nursing/MHT/Case Management/Adjunct)  12/26/2011 10:30 AM  Type of Therapy:  Group Therapy, Dance/Movement Therapy   Participation Level:  Did Not Attend  Glenn Rosales   

## 2011-12-26 NOTE — Progress Notes (Signed)
Patient ID: Glenn Rosales, male   DOB: 1980/10/04, 31 y.o.   MRN: [id number] [facility] MD Progress Note  12/26/2011 2:06 PM  Diagnosis:  Axis I: Schizoaffective Disorder - Bipolar Type.  Alcohol Abuse.  Cocaine Abuse.   Glenn Rosales was seen and assessed this morning. He reports some trouble falling asleep last night, but then slept well through the remainder of the night. Nursing report, however, indicates only 2.75 hours of sleep. He describes his mood as "better but not perfect." Appetite is good. He denies problems with medications. He denies suicidal or homicidal thoughts. He reports feeling about ready to leave the hospital. He is aware a lithium level is to be drawn today.   Suicidal Ideation: The patient adamantly denies any suicidal ideations today.  Plan: No  Intent: No  Means: No   Homicidal Ideation: The patient adamantly denies any homicidal ideations today.  Plan: No  Intent: No.  Means: No   Eye Contact: Good.  General Appearance/Behavior: The patient remained friendly and cooperative during the interview today. Motor Behavior: wnl.  Speech: Appropriate in rate and volume with no pressuring of speech today.  Mental Status: Alert and Oriented x 3  Level of Consciousness: Alert Mood: "better, but not perfect" Affect: Moderately Constricted.  Anxiety: No anxiety reported today.  Thought Process: linear and goal-directed  Thought Content: The patient denied any auditory or visual hallucinations today. He also denied any delusional thinking. He denies SI/HI as noted above. Perception: wnl.  Judgment: Fair to Good.  Insight: Fair to Good.  Cognition: Orientated to time, place and person.  Sleep:  Number of Hours: 2.75    Vital Signs:Blood pressure 115/78, pulse 76, temperature 97.8 F (36.6 C), temperature source Oral, resp. rate 16.  Current Medications: Current Facility-Administered Medications  Medication Dose Route Frequency Provider Last Rate Last Dose  . acetaminophen  (TYLENOL) tablet 650 mg  650 mg Oral Q6H PRN Ronny Bacon, MD   650 mg at 12/25/11 0903  . alum & mag hydroxide-simeth (MAALOX/MYLANTA) 200-200-20 MG/5ML suspension 30 mL  30 mL Oral Q4H PRN Curlene Labrum Readling, MD   30 mL at 12/24/11 0937  . hydrOXYzine (ATARAX/VISTARIL) tablet 50 mg  50 mg Oral NOW Larena Sox, MD   50 mg at 12/26/11 0157  . lithium carbonate (ESKALITH) CR tablet 450 mg  450 mg Oral BH-qamhs Curlene Labrum Readling, MD   450 mg at 12/26/11 0821  . magnesium hydroxide (MILK OF MAGNESIA) suspension 30 mL  30 mL Oral Daily PRN Ronny Bacon, MD      . mulitivitamin with minerals tablet 1 tablet  1 tablet Oral Daily Ronny Bacon, MD   1 tablet at 12/26/11 0821  . nicotine (NICODERM CQ - dosed in mg/24 hours) patch 14 mg  14 mg Transdermal Daily Curlene Labrum Readling, MD   14 mg at 12/26/11 0821  . OLANZapine (ZYPREXA) tablet 5 mg  5 mg Oral QHS Curlene Labrum Readling, MD   5 mg at 12/25/11 2100  . pantoprazole (PROTONIX) EC tablet 40 mg  40 mg Oral QHS Curlene Labrum Readling, MD   40 mg at 12/25/11 2100  . sertraline (ZOLOFT) tablet 100 mg  100 mg Oral Daily Curlene Labrum Readling, MD   100 mg at 12/26/11 0821  . traZODone (DESYREL) tablet 100 mg  100 mg Oral QHS Curlene Labrum Readling, MD   100 mg at 12/25/11 2101   Lab Results:  No results found for this or any previous  visit (from the past 48 hour(s)). Treatment Plan Summary:  1. Daily contact with patient to assess and evaluate symptoms and progress in treatment  2. Medication management  3. The patient will deny suicidal ideations or homicidal ideations for 48 hours prior to discharge and have a depression and anxiety rating of 3 or less. The patient will also deny any auditory or visual hallucinations or delusional thinking or display any manic or hypomanic behaviors.  4. The patient will display no evidence of substance withdrawal at time of discharge.   Plan:  1. Will continue the patient's current medications as ordered. Consider adjusting  lithium dose and/or scheduling based on blood level and if insomnia persists. 2. Laboratory studies reviewed.  3. Will continue to monitor.  4. Will repeat serum lithium level on December 26, 2011.   Glenn Rosales 12/26/2011, 2:06 PM

## 2011-12-26 NOTE — Progress Notes (Signed)
Pt has isolated most of the day today   He asked to be allowed to sleep in as he did not sleep well last night   Pt did not attend group and has minimal interaction with others   He appears depressed and sad but denies suicidal thoughts  Verbal support given  Medications administered and effectiveness monitored  Q 15 min checks  Pt safe at present

## 2011-12-27 DIAGNOSIS — F1410 Cocaine abuse, uncomplicated: Secondary | ICD-10-CM

## 2011-12-27 DIAGNOSIS — F1010 Alcohol abuse, uncomplicated: Secondary | ICD-10-CM

## 2011-12-27 MED ORDER — SERTRALINE HCL 100 MG PO TABS: 100.0000 mg | ORAL_TABLET | Freq: Every day | ORAL | Status: DC

## 2011-12-27 MED ORDER — MULTI-VITAMIN/MINERALS PO TABS: 1.0000 | ORAL_TABLET | Freq: Every day | ORAL | Status: DC

## 2011-12-27 MED ORDER — PANTOPRAZOLE SODIUM 40 MG PO TBEC: 40.0000 mg | DELAYED_RELEASE_TABLET | Freq: Every day | ORAL | Status: DC

## 2011-12-27 MED ORDER — OLANZAPINE 5 MG PO TABS: 5.0000 mg | ORAL_TABLET | Freq: Every day | ORAL | Status: DC

## 2011-12-27 MED ORDER — LITHIUM CARBONATE ER 450 MG PO TBCR: 450.0000 mg | EXTENDED_RELEASE_TABLET | ORAL | Status: DC

## 2011-12-27 MED ORDER — TRAZODONE HCL 100 MG PO TABS: 100.0000 mg | ORAL_TABLET | Freq: Every day | ORAL | Status: DC

## 2011-12-27 NOTE — Tx Team (Signed)
Interdisciplinary Treatment Plan Update (Adult)  Date:  12/27/2011  Time Reviewed:  10:15AM-11:00AM  Progress in Treatment: Attending groups:  Yes Participating in groups:    Yes, when attends Taking medication as prescribed:    Yes Tolerating medication:   Yes Family/Significant other contact made:  No, but gives permission during treatment team for mother to be contacted to provide SPI Patient understands diagnosis:   Yes Discussing patient identified problems/goals with staff:   Yes Medical problems stabilized or resolved:   Yes Denies suicidal/homicidal ideation:  Yes Issues/concerns per patient self-inventory: Yes   Other:    New problem(s) identified: No, Describe:    Reason for Continuation of Hospitalization: None  Interventions implemented related to continuation of hospitalization:  Medication monitoring and adjustment, safety checks Q15 min., suicide risk assessment, group therapy, psychoeducation, collateral contact, aftercare planning, ongoing physician assessments, medication education - UNTIL DISCHARGE  Additional comments:  Not applicable  Estimated length of stay:  Discharge today  Discharge Plan:  Go to an interview at [facility] on [address], appointment will be set at [facility] prior to D/C.  New goal(s):  Not applicable  Review of initial/current patient goals per problem list:   1.  Goal(s):  Determine aftercare placement and follow-up  Met:  Yes  Target date:  By Discharge   As evidenced by:  Wants to get into an [facility], has   2.  Goal(s):  Decrease mania to baseline level.  Met:  Yes  Target date:  By Discharge   As evidenced by:  No mania in evidence  3.  Goal(s):  Determine if & how to address substance abuse  Met:  Yes  Target date:  By Discharge   As evidenced by:  Will be at [facility], go to meetings  4.  Goal(s):  Decrease anxiety to no greater than 3 at discharge.  Met:  Yes  Target date:  By Discharge   As  evidenced by:  "2" today  Attendees: Patient:  Glenn Rosales  12/27/2011 10:15AM-11:00AM  Family:     Physician:  Dr. Harvie Heck Readling 12/27/2011 10:15AM-11:00AM  Nursing:   Manuela Schwartz, RN 12/27/2011 10:15AM -11:00AM   Case Manager:  Ambrose Mantle, LCSW 12/27/2011 10:15AM-11:00AM  Counselor:  Veto Kemps, MT-BC 12/27/2011 10:15AM-11:00AM  Other:   Neill Loft, RN 12/27/2011 10:15AM-11:00AM  Other:   Osborn Coho, LCSW-A 12/27/2011 10:15AM-11:00AM  Other:      Other:       Scribe for Treatment Team:   Sarina Ser, 12/27/2011, 10:15AM-11:15AM
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
## 2011-12-27 NOTE — Progress Notes (Signed)
Patient ID: Glenn Rosales, male   DOB: 1981-06-09, 31 y.o.   MRN: [id number] The patient stated he was having a better evening tonight. He was interacting appropriately in the milieu. Offered no complaints. Did not request any PRN medication.

## 2011-12-27 NOTE — Progress Notes (Signed)
[facility] Case Management Discharge Plan:  Will you be returning to the same living situation after discharge: No. At discharge, do you have transportation home?:Yes,  Bus Do you have the ability to pay for your medications:Yes,  income and is being given samples.  Interagency Information:     Release of information consent forms completed and in the chart;  Patient's signature needed at discharge.  Patient to Follow up at:  Follow-up Information    Follow up with [facility] on 01/04/2012. (Pt is scheduled to f/u with [facility] on 4/16@9 :30am.)    Contact information:   [address] [place], [place] [address] [phone]         Patient denies SI/HI:   Yes,  yes    Safety Planning and Suicide Prevention discussed:  Yes,  yes  Barrier to discharge identified:No.  Summary and Recommendations:  Pt is being discharged on today. Pt will be given medication samples and a bus pass to connect with the [facility] bus to be transported to [place]. Pt verbalized that he has an interview scheduled with the [facility] in [place] on today.  Lalena Salas, Randal Buba 12/27/2011, 12:51 PM

## 2011-12-27 NOTE — Progress Notes (Signed)
Patient ID: Glenn Rosales, male   DOB: 08/20/1981, 31 y.o.   MRN: [id number]   Patient wanting discharge today. Team met with patient and felt patient ready to be discharged. Obtained belongings, medications, prescriptions, f/u appointment, and bus passes. Taken to locker and pointed the way to bus stop. Denies any SI on discharge.

## 2011-12-27 NOTE — Progress Notes (Signed)
[facility] Adult Inpatient Family/Significant Other Suicide Prevention Education  Suicide Prevention Education:  Education Completed; Glenn Rosales [phone]) (mother) has been identified as the person(s) who will aid the patient in the event of a mental health crisis (suicidal ideations/suicide attempt).  With written consent from the patient, the family member/significant other has been provided the following suicide prevention education, prior to the and/or following the discharge of the patient.  The suicide prevention education provided includes the following:  Suicide risk factors  Suicide prevention and interventions  National Suicide Hotline telephone number  [facility] assessment telephone number  [facility] Emergency Assistance 911  [facility] and/or Residential Mobile Crisis Unit telephone number  Request made of family/significant other to:  Remove weapons (e.g., guns, rifles, knives), all items previously/currently identified as safety concern.    Remove drugs/medications (over-the-counter, prescriptions, illicit drugs), all items previously/currently identified as a safety concern.  The family member/significant other verbalizes understanding of the suicide prevention education information provided.  The family member/significant other agrees to remove the items of safety concern listed above.  Glenn Rosales, Aram Beecham 12/27/2011, 2:25 PM

## 2011-12-29 NOTE — Progress Notes (Signed)
Patient Discharge Instructions:  Psychiatric Admission Assessment Note Provided,  12/29/2011 After Visit Summary (AVS) Provided,  12/29/2011 Face Sheet Provided, 12/29/2011 Faxed/Sent to the Next Level Care provider:  12/29/2011 Sent Suicide Risk Assessment - Discharge Assessment 12/29/2011  Faxed to [facility] @ [phone]  Wandra Scot, 12/29/2011, 2:46 PM

## 2012-01-03 NOTE — Discharge Summary (Signed)
Physician Discharge Summary Note  Patient:  Glenn Rosales is an 31 y.o., male MRN:  [id number] DOB:  May 15, 1981 Patient phone:  [phone] (home)  Patient address:   [address] [place] [address],   Date of Admission:  12/21/2011 Date of Discharge: 12/27/2011  Axis Diagnosis:   AXIS I:  Schizoaffective disorder; Alcohol abuse; Cocaine Abuse AXIS II:  No diagnosis AXIS III:  No diagnosis Past Medical History  Diagnosis Date  . Bipolar 2 disorder    AXIS IV:  economic problems, housing problems and occupational problems AXIS V:  61-70 mild symptoms  Level of Care:  OP  Hospital Course: First behavioral health admission for Xsavier who presented in our emergency room requesting help with his mood after binging on alcohol and cocaine.  He had recently been admitted to [facility] for an exacerbation of his bipolar disorder which was diagnosed in 2009. After leaving [facility] he did not fill any of his prescriptions and began binging on cocaine and alcohol. He also reported that he was hearing some voices had had some suicidal thoughts but denied intent to harm himself. He was asked for help getting stable.  He had a past history of being diagnosed with bipolar disorder in 2009 with previous hospitalizations at [facility]. In the past he had done well on lithium and we elected to restart him on 450 mg of Eskalith twice daily. He was started on Zoloft to address depressive symptoms. He tolerated medications well his recovery was uneventful. His mood stabilized insight improved, and group participation was good. He was polite, cooperative, and appropriate while on the unit. Zyprexa was added to enhance mood stability and alleviate auditory hallucinations.  By April 8 he was free of suicidal thoughts for at least 3 days, fully alert and appropriate for outpatient treatment. He was discharged on stable medications and 30 day prescriptions.  Consults:   None  Significant Diagnostic Studies:  Lithium level 0.32 on current dose. CBC and metabolic panels normal. BUN 15, creatinine 1.06. TSH 0.500, free T4 1.09. Urine drug screen positive for cocaine.  Discharge Vitals:   Blood pressure 112/71, pulse 81, temperature 98.2 F (36.8 C), temperature source Oral, resp. rate 20.  Mental Status Exam: See Mental Status Examination and Suicide Risk Assessment completed by Attending Physician prior to discharge.  Discharge destination:  Home  Is patient on multiple antipsychotic therapies at discharge:  No   Has Patient had three or more failed trials of antipsychotic monotherapy by history:  No  Recommended Plan for Multiple Antipsychotic Therapies: N/A   Medication List  As of 01/03/2012 10:03 AM   STOP taking these medications         carbamazepine 200 MG tablet         TAKE these medications      Indication    lithium carbonate 450 MG CR tablet   Commonly known as: ESKALITH   Take 1 tablet (450 mg total) by mouth 2 (two) times daily in the am and at bedtime.. For mood stability.       multivitamin with minerals tablet   Take 1 tablet by mouth daily. Vitamin supplement.       OLANZapine 5 MG tablet   Commonly known as: ZYPREXA   Take 1 tablet (5 mg total) by mouth at bedtime. For mood stability and psychosis.       pantoprazole 40 MG tablet   Commonly known as: PROTONIX   Take 1  tablet (40 mg total) by mouth at bedtime. For acid reflux.       sertraline 100 MG tablet   Commonly known as: ZOLOFT   Take 1 tablet (100 mg total) by mouth daily. For depression.       traZODone 100 MG tablet   Commonly known as: DESYREL   Take 1 tablet (100 mg total) by mouth at bedtime. For sleep.            Follow-up Information    Follow up with [facility] on 01/04/2012. (Pt is scheduled to f/u with [facility] on 4/16@9 :30am.)    Contact information:   [address] [place], [place] [address] [phone]         Follow-up  recommendations:  Activity:  unrestricted Diet:  regular  Signed: Anber Mckiver A 01/03/2012, 10:03 AM

## 2016-01-01 ENCOUNTER — Emergency Department (HOSPITAL_BASED_OUTPATIENT_CLINIC_OR_DEPARTMENT_OTHER): Payer: Self-pay

## 2016-01-01 ENCOUNTER — Emergency Department (HOSPITAL_BASED_OUTPATIENT_CLINIC_OR_DEPARTMENT_OTHER)
Admission: EM | Admit: 2016-01-01 | Discharge: 2016-01-01 | Disposition: A | Discharge status: Home / Self Care | Payer: Self-pay | Attending: Emergency Medicine | Admitting: Emergency Medicine

## 2016-01-01 ENCOUNTER — Encounter (HOSPITAL_BASED_OUTPATIENT_CLINIC_OR_DEPARTMENT_OTHER): Payer: Self-pay

## 2016-01-01 DIAGNOSIS — J069 Acute upper respiratory infection, unspecified: Secondary | ICD-10-CM

## 2016-01-01 DIAGNOSIS — F17210 Nicotine dependence, cigarettes, uncomplicated: Secondary | ICD-10-CM | POA: Insufficient documentation

## 2016-01-01 LAB — RAPID STREP SCREEN (MED CTR MEBANE ONLY): STREPTOCOCCUS, GROUP A SCREEN (DIRECT): NEGATIVE

## 2016-01-01 MED ORDER — BENZONATATE 100 MG PO CAPS: 100.0000 mg | ORAL_CAPSULE | Freq: Three times a day (TID) | ORAL | Status: AC

## 2016-01-01 NOTE — ED Provider Notes (Signed)
CSN: [id number]     Arrival date & time 01/01/16  1601 History   First MD Initiated Contact with Patient 01/01/16 1733     Chief Complaint  Patient presents with  . Cough     (Consider location/radiation/quality/duration/timing/severity/associated sxs/prior Treatment) Patient is a 35 y.o. male presenting with cough. The history is provided by the patient.  Cough Cough characteristics:  Productive Sputum characteristics:  Clear Onset quality:  Gradual Duration:  2 weeks Progression since onset: Improved, but no again worsening. Chronicity:  New Smoker: yes (occasional)   Ineffective treatments: 10 day course of antibiotics and 5 day course of Prednisone. Associated symptoms: sore throat   Associated symptoms: no chest pain, no ear pain, no fever, no headaches, no shortness of breath and no wheezing     Past Medical History  Diagnosis Date  . Bipolar 2 disorder (HCC)    History reviewed. No pertinent past surgical history. No family history on file. Social History  Substance Use Topics  . Smoking status: Current Every Day Smoker -- 0.00 packs/day for 0 years    Types: Cigarettes  . Smokeless tobacco: None  . Alcohol Use: Yes     Comment: weekly    Review of Systems  Constitutional: Negative for fever.  HENT: Positive for sore throat. Negative for ear pain.   Respiratory: Positive for cough. Negative for shortness of breath and wheezing.   Cardiovascular: Negative for chest pain.  Neurological: Negative for headaches.  All other systems reviewed and are negative.     Allergies  Review of patient's allergies indicates no known allergies.  Home Medications   Prior to Admission medications   Not on File   BP 120/75 mmHg  Pulse 63  Temp(Src) 97.7 F (36.5 C) (Oral)  Resp 20  Ht  (1.727 m)  Wt 72.576 kg  BMI 24.33 kg/m2  SpO2 98% Physical Exam  Constitutional: He appears well-developed and well-nourished.  HENT:  Head: Normocephalic and atraumatic.   Right Ear: Tympanic membrane and ear canal normal.  Left Ear: Tympanic membrane and ear canal normal.  Nose: Nose normal.  Mouth/Throat: Uvula is midline and oropharynx is clear and moist. No trismus in the jaw. No oropharyngeal exudate, posterior oropharyngeal edema or posterior oropharyngeal erythema.  No obvious edema of the uvula  Neck: Normal range of motion. Neck supple.  Cardiovascular: Normal rate, regular rhythm and normal heart sounds.   Pulmonary/Chest: Effort normal and breath sounds normal. No respiratory distress. He has no wheezes. He has no rales. He exhibits no tenderness.  Musculoskeletal: Normal range of motion.  Neurological: He is alert.  Skin: Skin is warm and dry.  Psychiatric: He has a normal mood and affect.  Nursing note and vitals reviewed.   ED Course  Procedures (including critical care time) Labs Review Labs Reviewed - No data to display  Imaging Review Dg Chest 2 View  01/01/2016  CLINICAL DATA:  Patient with fever, cough and shortness of breath. History of bronchitis. EXAM: CHEST  2 VIEW COMPARISON:  Chest radiograph 09/09/2015 FINDINGS: Stable cardiac and mediastinal contours. No consolidative pulmonary opacities. No pleural effusion or pneumothorax. Mid thoracic spine degenerative changes. IMPRESSION: No acute cardiopulmonary process. Electronically Signed   By: Annia Belt M.D.   On: 01/01/2016 16:39   I have personally reviewed and evaluated these images and lab results as part of my medical decision-making.   EKG Interpretation None      MDM   Final diagnoses:  None   Pt  CXR negative for acute infiltrate. Patients symptoms are consistent with URI, likely viral etiology. Discussed that antibiotics are not indicated for viral infections. Pt will be discharged with symptomatic treatment.  Verbalizes understanding and is agreeable with plan. Pt is hemodynamically stable & in NAD prior to dc.  Rapid strep also negative.  Stable for discharge.   Return precautions given.     Santiago GladHeather Domnique Vanegas, PA-C 01/02/16 1946  Melene Planan Floyd, DO 01/05/16 [phone]

## 2016-01-01 NOTE — ED Notes (Signed)
C/o cont'd prod cough after being seen/treated for bronchitis 2 weeks ago-NAD

## 2016-01-04 LAB — CULTURE, GROUP A STREP (THRC)

## 2016-05-14 ENCOUNTER — Emergency Department (HOSPITAL_BASED_OUTPATIENT_CLINIC_OR_DEPARTMENT_OTHER)
Admission: EM | Admit: 2016-05-14 | Discharge: 2016-05-14 | Disposition: A | Discharge status: Home / Self Care | Payer: No Typology Code available for payment source | Attending: Emergency Medicine | Admitting: Emergency Medicine

## 2016-05-14 ENCOUNTER — Encounter (HOSPITAL_BASED_OUTPATIENT_CLINIC_OR_DEPARTMENT_OTHER): Payer: Self-pay | Admitting: *Deleted

## 2016-05-14 ENCOUNTER — Emergency Department (HOSPITAL_BASED_OUTPATIENT_CLINIC_OR_DEPARTMENT_OTHER): Payer: No Typology Code available for payment source

## 2016-05-14 DIAGNOSIS — Y929 Unspecified place or not applicable: Secondary | ICD-10-CM | POA: Insufficient documentation

## 2016-05-14 DIAGNOSIS — Y9301 Activity, walking, marching and hiking: Secondary | ICD-10-CM | POA: Insufficient documentation

## 2016-05-14 DIAGNOSIS — Y92410 Unspecified street and highway as the place of occurrence of the external cause: Secondary | ICD-10-CM | POA: Diagnosis not present

## 2016-05-14 DIAGNOSIS — S62231A Other displaced fracture of base of first metacarpal bone, right hand, initial encounter for closed fracture: Secondary | ICD-10-CM | POA: Insufficient documentation

## 2016-05-14 DIAGNOSIS — S60221A Contusion of right hand, initial encounter: Secondary | ICD-10-CM

## 2016-05-14 DIAGNOSIS — F17210 Nicotine dependence, cigarettes, uncomplicated: Secondary | ICD-10-CM | POA: Insufficient documentation

## 2016-05-14 DIAGNOSIS — Y999 Unspecified external cause status: Secondary | ICD-10-CM | POA: Insufficient documentation

## 2016-05-14 DIAGNOSIS — S6991XA Unspecified injury of right wrist, hand and finger(s), initial encounter: Secondary | ICD-10-CM | POA: Diagnosis present

## 2016-05-14 MED ORDER — ACETAMINOPHEN 500 MG PO TABS
1000.0000 mg | ORAL_TABLET | Freq: Once | ORAL | Status: AC
Start: 2016-05-14 — End: 2016-05-14
  Administered 2016-05-14: 1000 mg via ORAL
  Filled 2016-05-14: qty 2
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
## 2016-05-14 NOTE — Discharge Instructions (Signed)
Contact Dr. [facility] clinic for follow up appointment in 1-2 weeks. Seek immediate medical attention if you develop numbness of the thumb or notice that it is cold or blue.
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
## 2017-10-02 IMAGING — CR DG CHEST 2V
2 series · 2 of 2 positions shown · non-contrast
Comparison: Chest radiograph 09/09/2015

CLINICAL DATA: Patient with fever, cough and shortness of breath.
History of bronchitis.

EXAM:
CHEST  2 VIEW

[w chest pa]
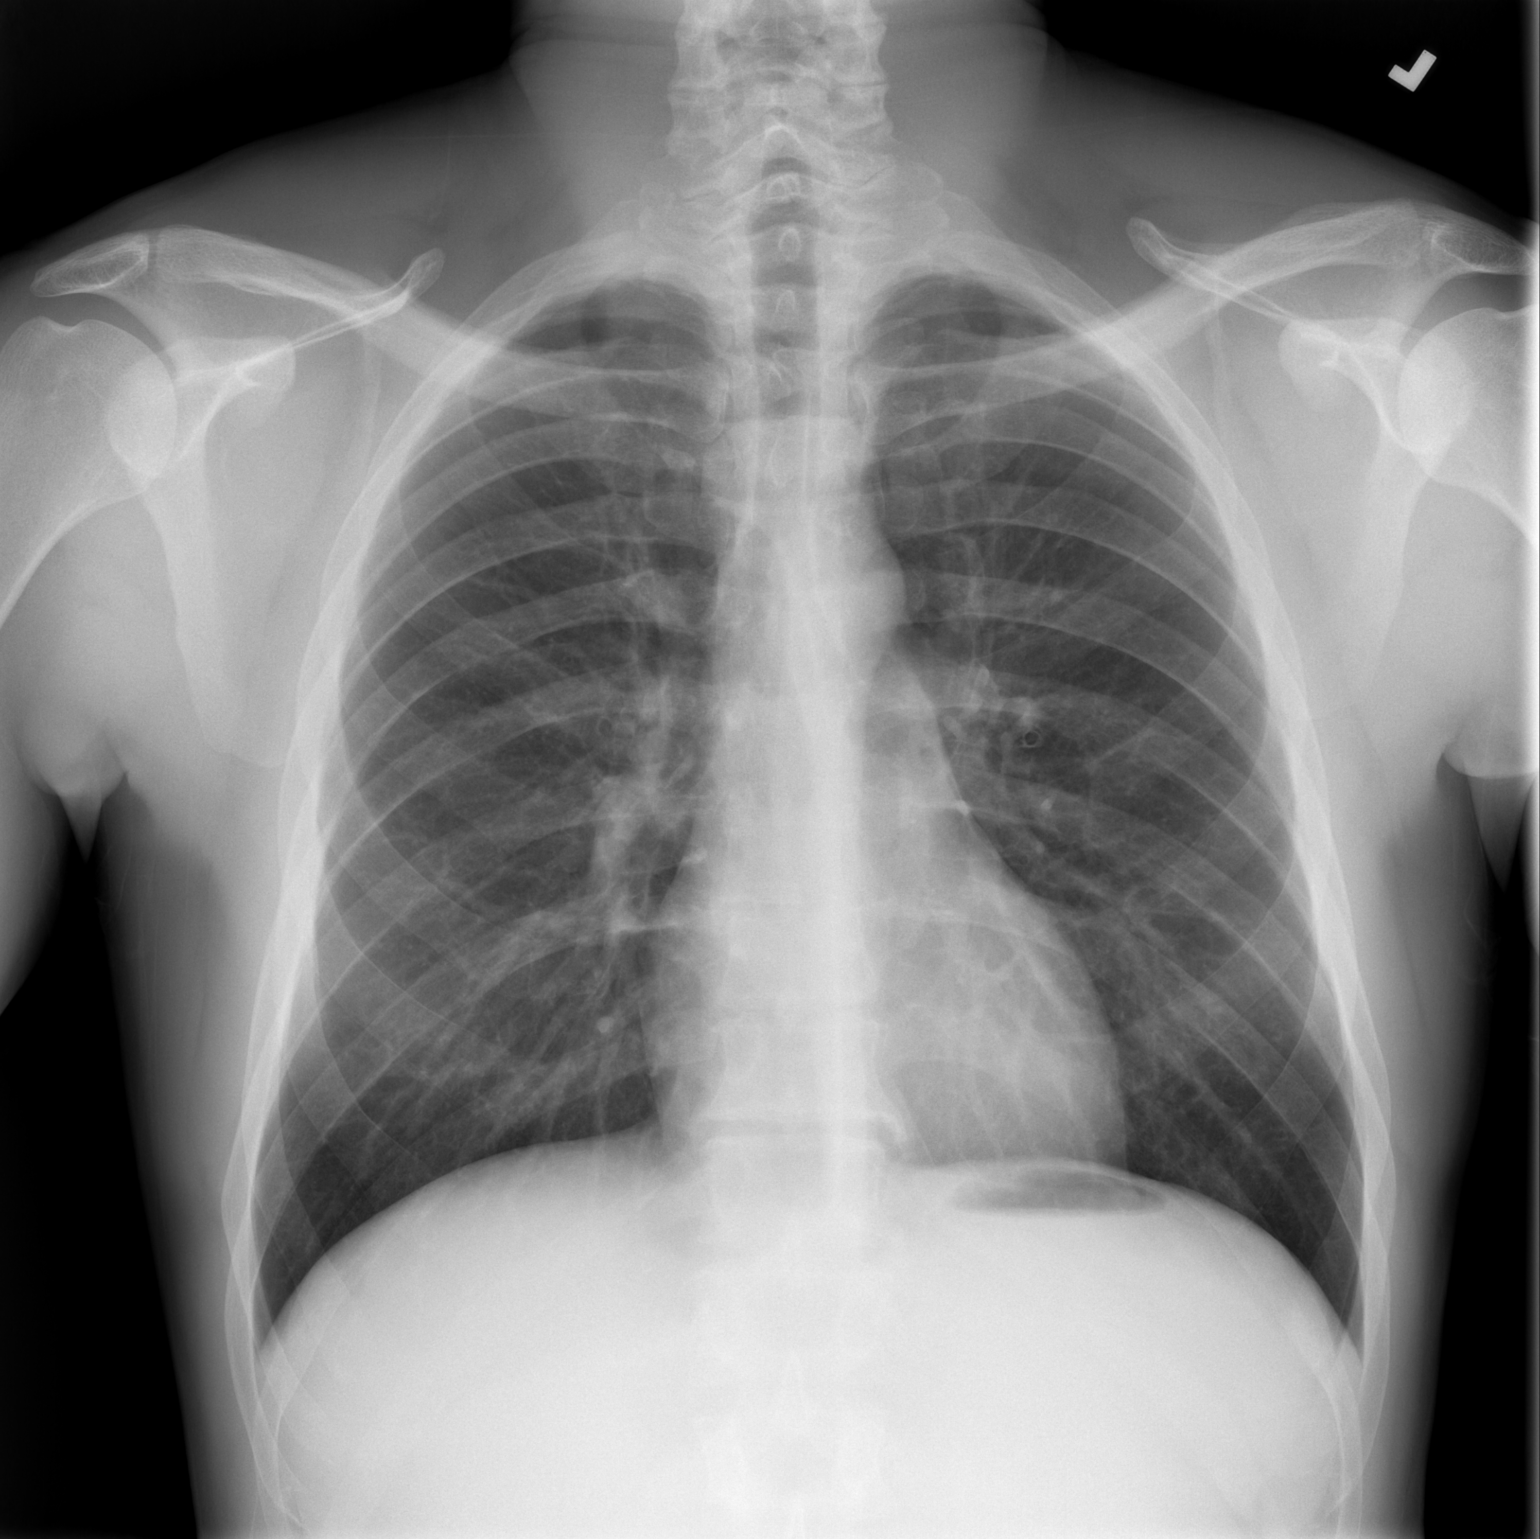

[w chest lat]
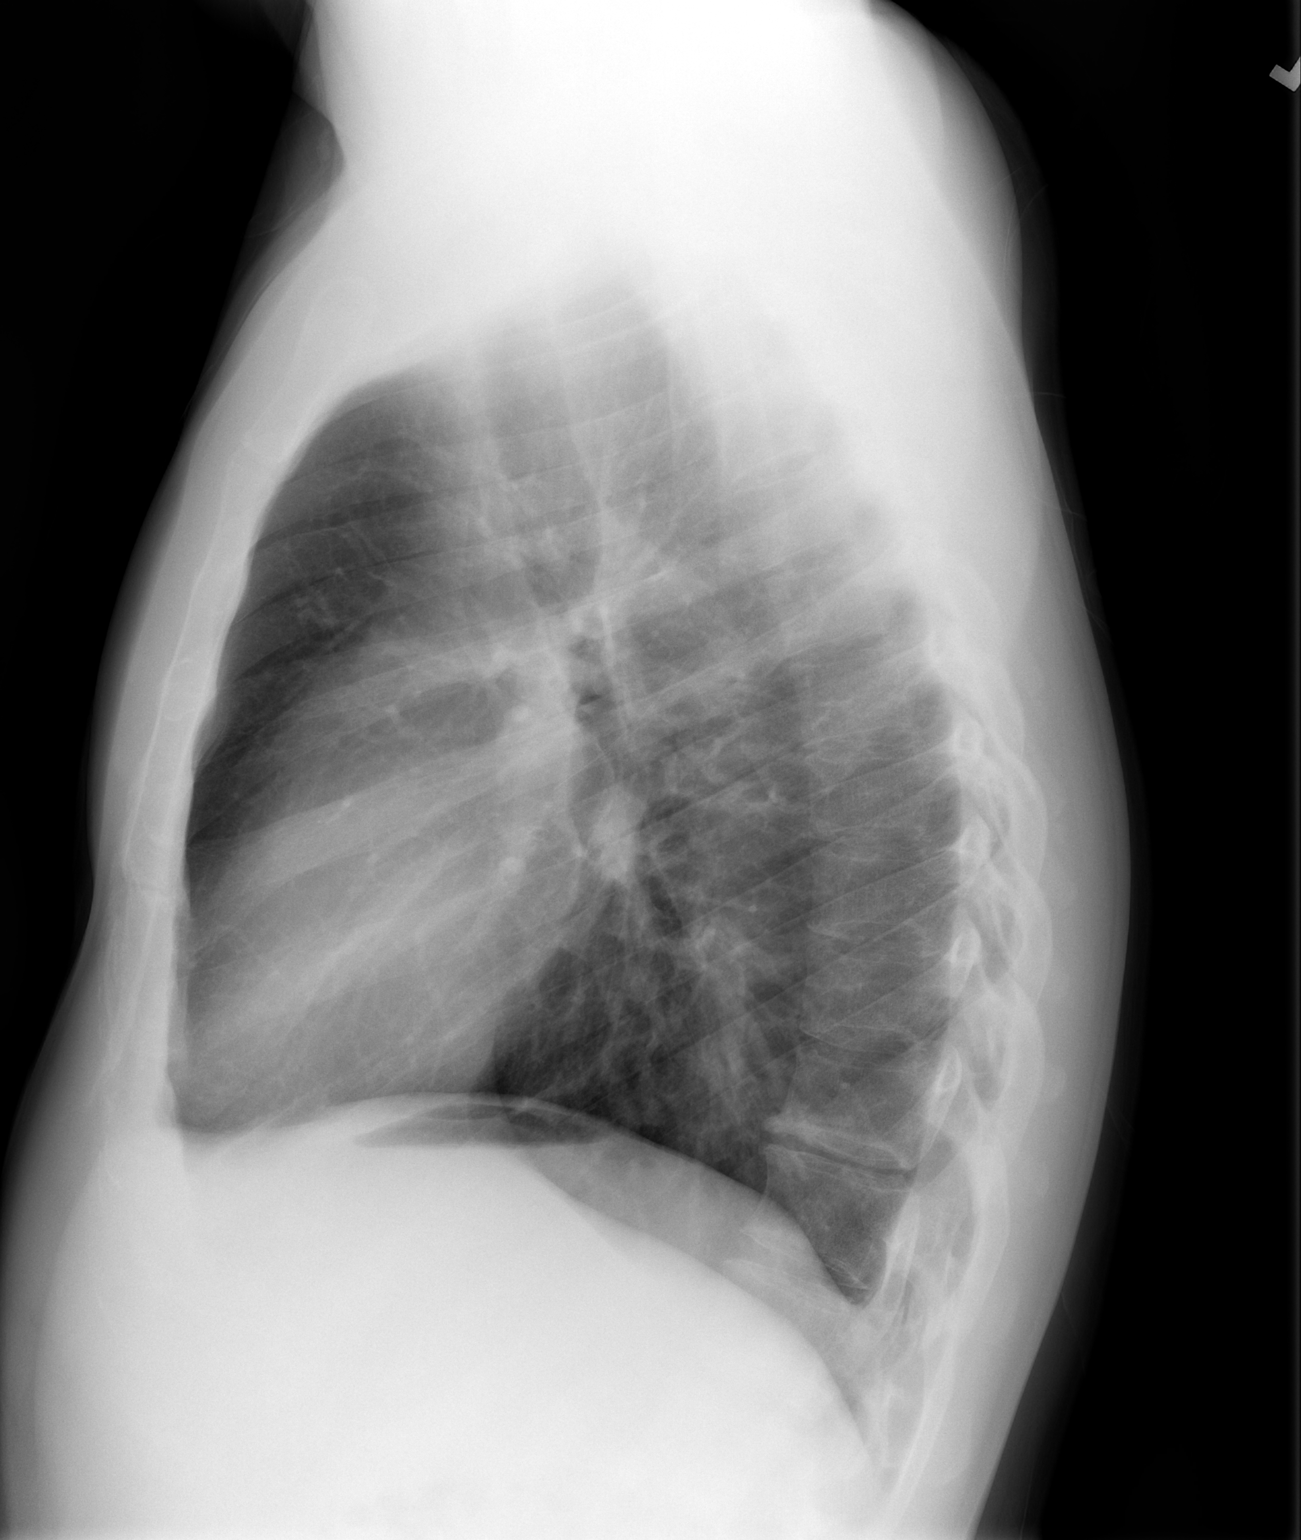

[2 of 2 positions shown; findings below may reference images not displayed]

FINDINGS: Stable cardiac and mediastinal contours. No consolidative pulmonary
opacities. No pleural effusion or pneumothorax. Mid thoracic spine
degenerative changes.
IMPRESSION: No acute cardiopulmonary process.

## 2018-02-13 IMAGING — CR DG HAND COMPLETE 3+V*R*
3 series · 3 of 3 positions shown · non-contrast
Comparison: 03/25/2007 report.

CLINICAL DATA: Injury.

EXAM:
RIGHT HAND - COMPLETE 3+ VIEW

[x hand pa right]
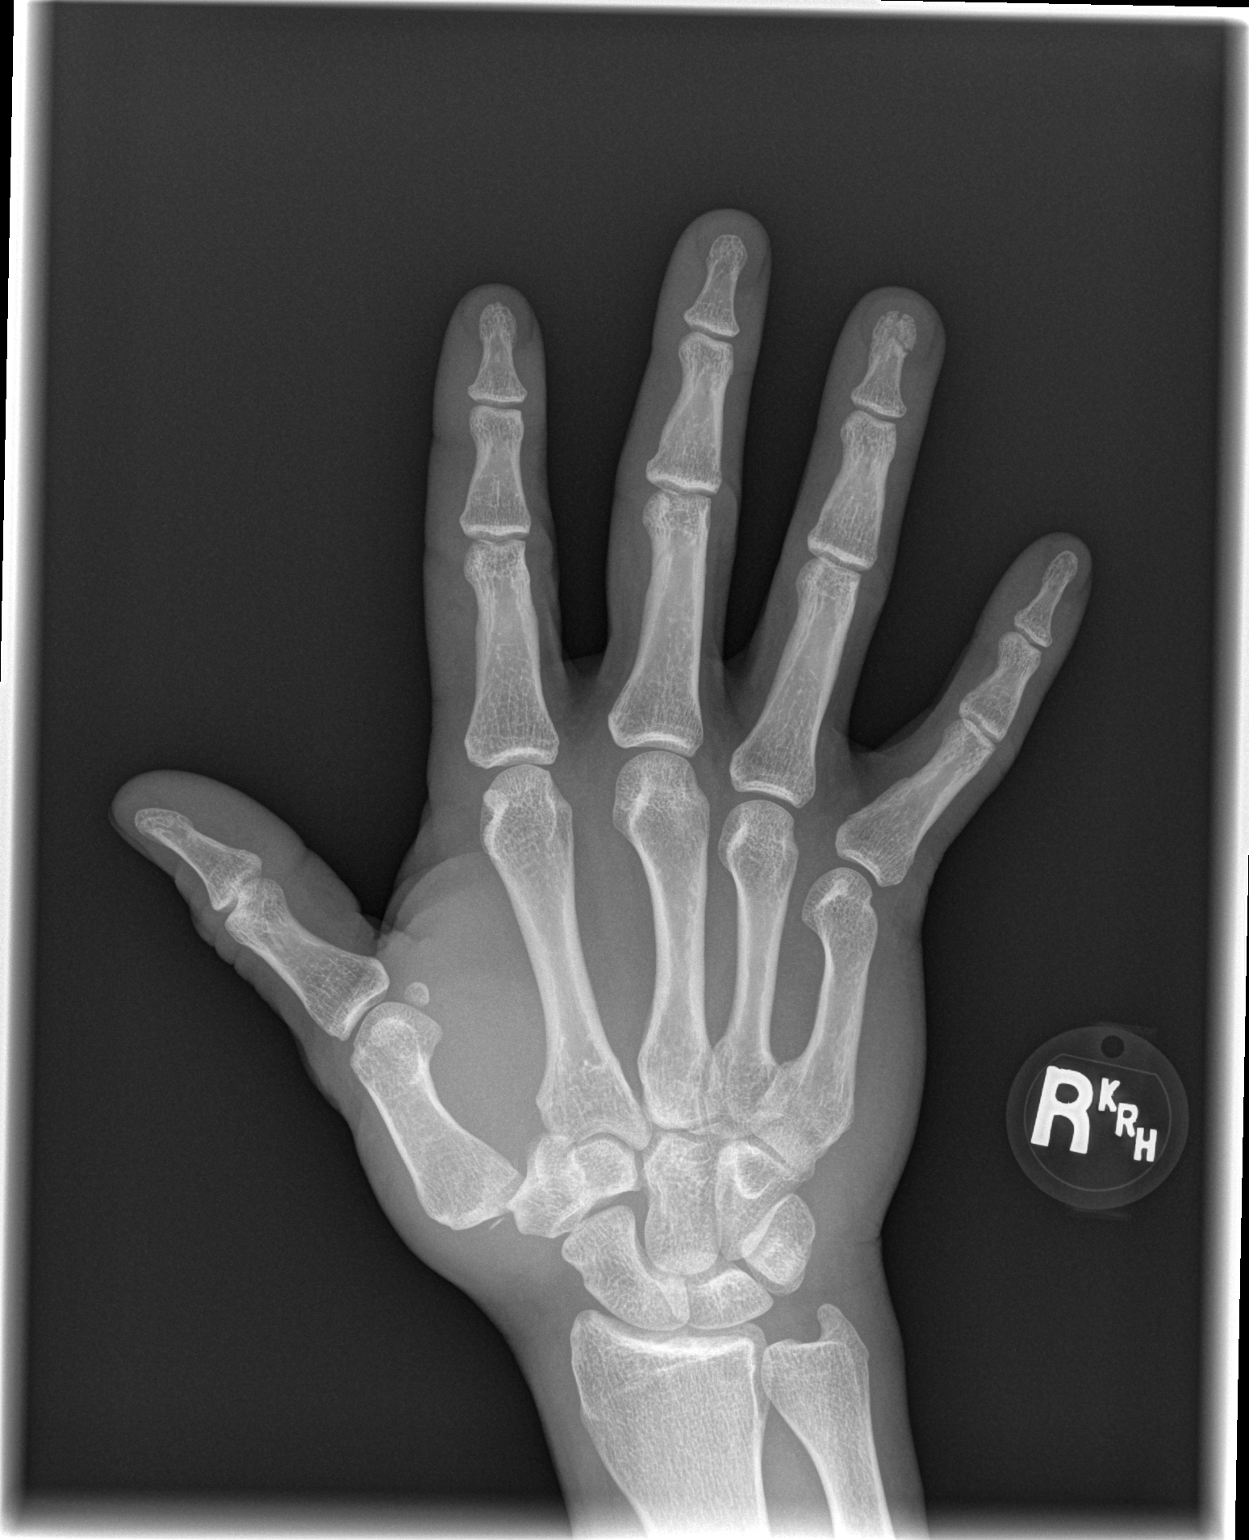

[x hand oblique right]
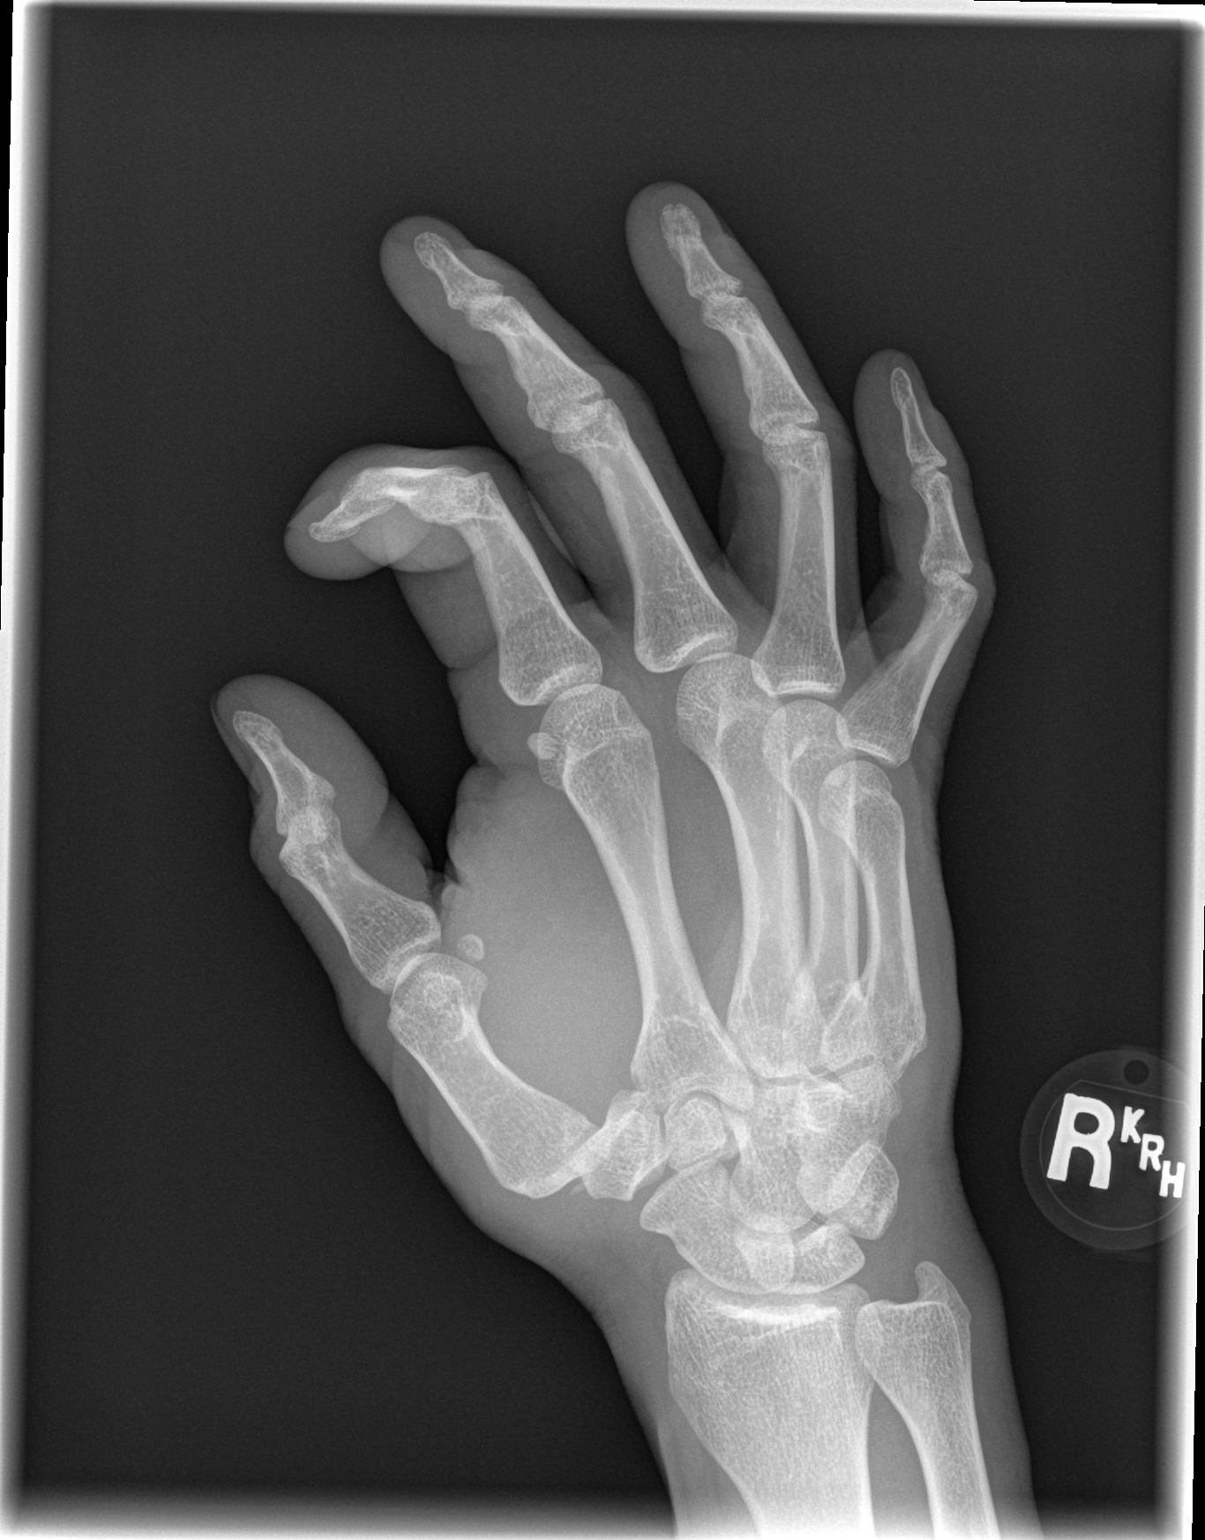

[x hand lat right]
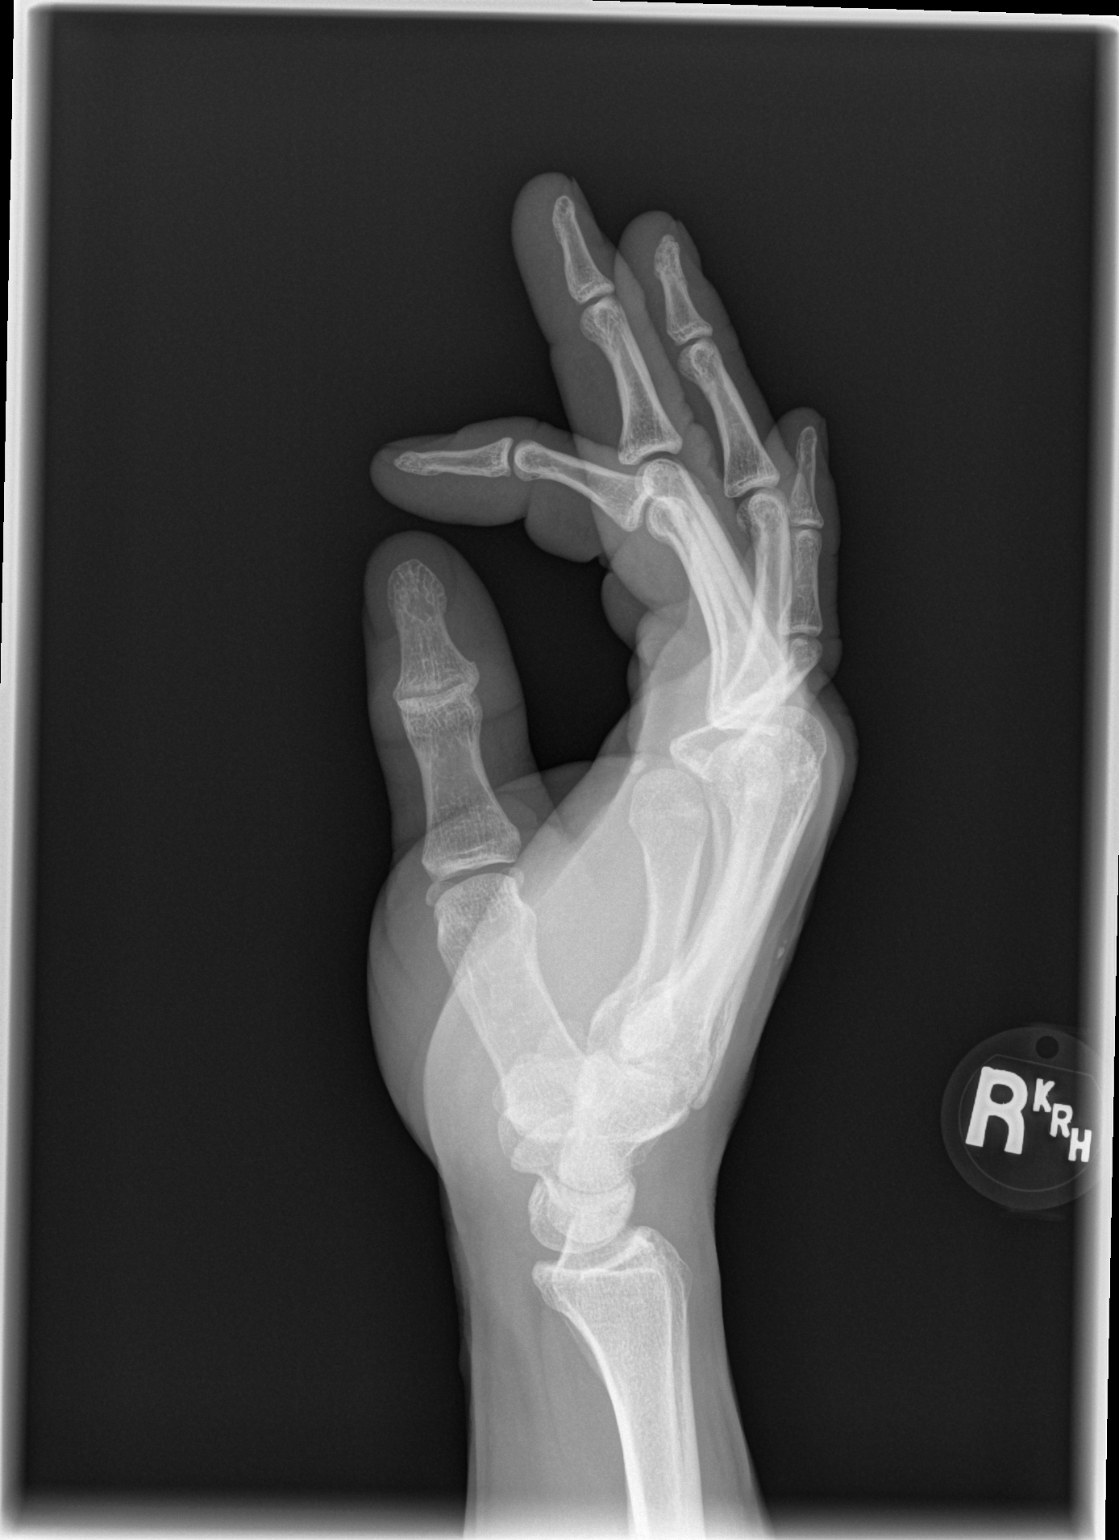

[3 of 3 positions shown; findings below may reference images not displayed]

FINDINGS: Tiny bony density is noted the base of the right first metacarpal.
This could be a small fracture fragment. Deformity noted of the
right fifth metacarpal consistent with old fracture. No other focal
soft tissue or bony abnormalities identified.
IMPRESSION: 1. Tiny bony density noted the base of the right first metacarpal.
This could represent tiny fracture fragment, age undetermined.

2. Old right fifth metacarpal fracture. No acute abnormality
otherwise noted .

## 2019-02-19 DEATH — deceased
